# Patient Record
Sex: Male | Born: 1935 | Race: White | Hispanic: No | Marital: Married | Smoking: Former smoker
Health system: Southern US, Community
[De-identification: ages and names within clinical notes are randomized; demographics above are authoritative.]

## PROBLEM LIST (undated history)

## (undated) DIAGNOSIS — I10 Essential (primary) hypertension: Secondary | ICD-10-CM

## (undated) DIAGNOSIS — I4891 Unspecified atrial fibrillation: Secondary | ICD-10-CM

## (undated) HISTORY — PX: NO PAST SURGERIES: SHX2092

---

## 2014-03-08 ENCOUNTER — Ambulatory Visit: Payer: Self-pay | Admitting: Physician Assistant

## 2017-10-11 ENCOUNTER — Ambulatory Visit (INDEPENDENT_AMBULATORY_CARE_PROVIDER_SITE_OTHER): Payer: Medicare HMO

## 2017-10-11 ENCOUNTER — Ambulatory Visit
Admission: EM | Admit: 2017-10-11 | Discharge: 2017-10-11 | Disposition: A | Discharge status: Home / Self Care | Payer: Medicare HMO | Attending: Family Medicine | Admitting: Family Medicine

## 2017-10-11 ENCOUNTER — Encounter: Payer: Self-pay | Admitting: Emergency Medicine

## 2017-10-11 DIAGNOSIS — R6889 Other general symptoms and signs: Secondary | ICD-10-CM | POA: Diagnosis not present

## 2017-10-11 HISTORY — DX: Essential (primary) hypertension: I10

## 2017-10-11 HISTORY — DX: Unspecified atrial fibrillation: I48.91

## 2017-10-11 MED ORDER — CETIRIZINE HCL 5 MG PO TABS: 5.0000 mg | ORAL_TABLET | Freq: Every day | ORAL | 0 refills | Status: DC

## 2017-10-11 MED ORDER — FLUTICASONE PROPIONATE 50 MCG/ACT NA SUSP: 2.0000 | Freq: Every day | NASAL | 0 refills | Status: DC

## 2017-10-11 NOTE — ED Triage Notes (Signed)
Patient c/o cough, chest congestion and nasal congestion for 3 weeks.  Patient denies fevers.

## 2017-10-11 NOTE — Discharge Instructions (Signed)
Medications as prescribed. ° °Take care ° °Dr. Kingson Lohmeyer  °

## 2017-10-11 NOTE — ED Provider Notes (Signed)
[facility]    CSN: [id number] Arrival date & time: 10/11/17  1016  History   Chief Complaint Chief Complaint  Patient presents with  . Cough  . Nasal Congestion   HPI   82 year old male with age or fibrillation and hyperlipidemia presents with a 3-4 week history of congestion.  Patient states that he feels like there is phlegm stuck in his throat. He is also having to blow his nose frequently in the morning. This occurs predominantly in the morning. Has been worsening. Some associated cough. No shortness of breath. No PND or orthopnea. No known exacerbating or relieving factors. He denies a prior history of allergies. He reports that he gets his phlegm up its clear to sometimes yellow. He has no fever. No shortness of breath. He is able to exercise without difficulty. No reports of chest pain. No other associated symptoms. No other complaints or concerns at this time.  Past Medical History:  Diagnosis Date  . Atrial fibrillation (HCC)   . Hyperlipidemia   ED BPH Hx of Depression  Surgical Hx - Skin biopsy.  Home Medications    Prior to Admission medications   Medication Sig Start Date End Date Taking? Authorizing Provider  lovastatin (MEVACOR) 40 MG tablet Take 40 mg by mouth at bedtime.   Yes [provider]  metoprolol succinate (TOPROL-XL) 25 MG 24 hr tablet Take 25 mg by mouth daily.   Yes [provider]  warfarin (COUMADIN) 6 MG tablet Take 6 mg by mouth daily.   Yes [provider]  cetirizine (ZYRTEC) 5 MG tablet Take 1 tablet (5 mg total) by mouth daily. 10/11/17   Tommie Samsook, Bryanna Yim G, DO  fluticasone (FLONASE) 50 MCG/ACT nasal spray Place 2 sprays into both nostrils daily. 10/11/17   Tommie Samsook, Tuleen Mandelbaum G, DO   Family History Coronary Artery Disease (Blocked arteries around heart) Father    Coronary Artery Disease (Blocked arteries around heart) Mother     Social History Social History  Substance Use Topics  . Smoking status: Former  Games developermoker  . Smokeless tobacco: Never Used  . Alcohol use Yes   Allergies   Patient has no known allergies.  Review of Systems Review of Systems  Constitutional: Negative for fever.  HENT: Positive for congestion.   Respiratory: Positive for cough. Negative for shortness of breath.   Cardiovascular: Negative for chest pain and leg swelling.  All other systems reviewed and are negative.  Physical Exam Triage Vital Signs ED Triage Vitals  Enc Vitals Group     BP 10/11/17 1030 (!) 148/79     Pulse Rate 10/11/17 1030 91     Resp 10/11/17 1030 16     Temp 10/11/17 1030 97.8 F (36.6 C)     Temp Source 10/11/17 1030 Oral     SpO2 10/11/17 1030 99 %     Weight 10/11/17 1026 192 lb 7.4 oz (87.3 kg)     Height 10/11/17 1026 5\' 8"  (1.727 m)     Head Circumference --      Peak Flow --      Pain Score 10/11/17 1027 0     Pain Loc --      Pain Edu? --      Excl. in GC? --    Updated Vital Signs BP (!) 148/79 (BP Location: Left Arm)   Pulse 91   Temp 97.8 F (36.6 C) (Oral)   Resp 16   Ht 5\' 8"  (1.727 m)   Wt 192  lb 7.4 oz (87.3 kg)   SpO2 99%   BMI 29.26 kg/m   Physical Exam  Constitutional: He is oriented to person, place, and time. He appears well-developed. No distress.  HENT:  Head: Normocephalic and atraumatic.  Mouth/Throat: Oropharynx is clear and moist.  Normal TM's bilaterally.  Eyes: Conjunctivae are normal. No scleral icterus.  Neck: Neck supple.  Cardiovascular:  No murmur heard. Irregularly, irregular. No LE edema.  Pulmonary/Chest: Effort normal. No respiratory distress. He has no wheezes. He has no rales.  Abdominal: Soft. He exhibits no distension. There is no tenderness.  Lymphadenopathy:    He has no cervical adenopathy.  Neurological: He is alert and oriented to person, place, and time.  Skin: Skin is warm. No rash noted.  Psychiatric: He has a normal mood and affect.  Vitals reviewed.  UC Treatments / Results  Labs (all labs ordered are  listed, but only abnormal results are displayed) Labs Reviewed - No data to display  EKG  EKG Interpretation None       Radiology Dg Chest 2 View  Result Date: 10/11/2017 CLINICAL DATA:  Productive cough with congestion for 3 or 4 weeks. EXAM: CHEST  2 VIEW COMPARISON:  None. FINDINGS: Healed right rib fractures are identified. No pneumothorax. No nodules, masses, or focal infiltrates. The cardiomediastinal silhouette is unremarkable. Degenerative changes seen in the thoracic spine. IMPRESSION: No cause for the patient's cough or congestion identified. Electronically Signed   By: Gerome Sam III M.D   On: 10/11/2017 11:23    Procedures Procedures (including critical care time)  Medications Ordered in UC Medications - No data to display   Initial Impression / Assessment and Plan / UC Course  I have reviewed the triage vital signs and the nursing notes.  Pertinent labs & imaging results that were available during my care of the patient were reviewed by me and considered in my medical decision making (see chart for details).    82 year old male presents with congestion (primary of the throat). Some cough. Lungs clear. Xray negative. No evidence of CHF. Treating with Zyrtec, Flonase.  Final Clinical Impressions(s) / UC Diagnoses   Final diagnoses:  Congestion of throat   New Prescriptions New Prescriptions   CETIRIZINE (ZYRTEC) 5 MG TABLET    Take 1 tablet (5 mg total) by mouth daily.   FLUTICASONE (FLONASE) 50 MCG/ACT NASAL SPRAY    Place 2 sprays into both nostrils daily.   Controlled Substance Prescriptions [place] Controlled Substance Registry consulted? Not Applicable   Tommie Sams, DO 10/11/17 1138

## 2017-11-06 ENCOUNTER — Other Ambulatory Visit: Payer: Self-pay | Admitting: Family Medicine

## 2019-07-07 ENCOUNTER — Other Ambulatory Visit: Payer: Self-pay | Admitting: Family Medicine

## 2019-08-03 ENCOUNTER — Ambulatory Visit (INDEPENDENT_AMBULATORY_CARE_PROVIDER_SITE_OTHER): Payer: Medicare HMO

## 2019-08-03 ENCOUNTER — Other Ambulatory Visit: Payer: Self-pay

## 2019-08-03 ENCOUNTER — Ambulatory Visit
Admission: EM | Admit: 2019-08-03 | Discharge: 2019-08-03 | Disposition: A | Discharge status: Home / Self Care | Payer: Medicare HMO | Attending: Family Medicine | Admitting: Family Medicine

## 2019-08-03 DIAGNOSIS — M25522 Pain in left elbow: Secondary | ICD-10-CM | POA: Diagnosis not present

## 2019-08-03 DIAGNOSIS — M7022 Olecranon bursitis, left elbow: Secondary | ICD-10-CM | POA: Diagnosis not present

## 2019-08-03 MED ORDER — PREDNISONE 20 MG PO TABS: 40.0000 mg | ORAL_TABLET | Freq: Every day | ORAL | 0 refills | Status: DC

## 2019-08-03 NOTE — Discharge Instructions (Addendum)
Take medication as prescribed. Rest. Avoid leaning on area frequently. Ice. Stretching. Monitor.   Follow up with your primary care physician or the above in one week as needed.   Return to Urgent care for new or worsening concerns.

## 2019-08-03 NOTE — ED Provider Notes (Signed)
[facility] ____________________________________________  Time seen: Approximately 10:03 AM  I have reviewed the triage vital signs and the nursing notes.   HISTORY  Chief Complaint Elbow Pain  HPI Jacob Moss is a 84 y.o. male presenting for evaluation of left elbow swelling.  Patient reports he has had some mild tenderness to the elbow, but not much.  States he did not notice the swelling but rather his granddaughter noticed the swelling on Saturday.  Denies any fall, direct injury or direct trauma.  Does report he leans on his elbows frequently.  Denies any redness or break in skin.  Continues with full range of motion.  Denies paresthesias.  Denies decreased range of motion.  Denies fevers.  No recent cough, sickness, chest pain, shortness of breath or other complaints.  Denies history of similar.  Right-hand-dominant.  Reports otherwise doing well.    Past Medical History:  Diagnosis Date  . Atrial fibrillation (HCC)   . Hypertension     There are no active problems to display for this patient.   Past Surgical History:  Procedure Laterality Date  . NO PAST SURGERIES       No current facility-administered medications for this encounter.   Current Outpatient Medications:  .  cetirizine (ZYRTEC) 5 MG tablet, Take 1 tablet (5 mg total) by mouth daily., Disp: 30 tablet, Rfl: 0 .  ELIQUIS 5 MG TABS tablet, , Disp: , Rfl:  .  fluticasone (FLONASE) 50 MCG/ACT nasal spray, Place 2 sprays into both nostrils daily., Disp: 16 g, Rfl: 0 .  lovastatin (MEVACOR) 40 MG tablet, Take 40 mg by mouth at bedtime., Disp: , Rfl:  .  metoprolol succinate (TOPROL-XL) 25 MG 24 hr tablet, Take 25 mg by mouth daily., Disp: , Rfl:  .  predniSONE (DELTASONE) 20 MG tablet, Take 2 tablets (40 mg total) by mouth daily., Disp: 10 tablet, Rfl: 0  Allergies Patient has no known allergies.  History reviewed. No pertinent family history.  Social History Social History   Tobacco Use   . Smoking status: Former Games developermoker  . Smokeless tobacco: Never Used  Substance Use Topics  . Alcohol use: Yes    Comment: beer daily  . Drug use: No    Review of Systems Constitutional: No fever Cardiovascular: Denies chest pain. Respiratory: Denies shortness of breath. Gastrointestinal: No abdominal pain.   Genitourinary: Negative for dysuria. Musculoskeletal: Positive left elbow pain. Skin: Negative for rash.   ____________________________________________   PHYSICAL EXAM:  VITAL SIGNS: ED Triage Vitals  Enc Vitals Group     BP 08/03/19 0944 (!) 153/99     Pulse Rate 08/03/19 0944 76     Resp 08/03/19 0944 15     Temp 08/03/19 0944 97.8 F (36.6 C)     Temp Source 08/03/19 0944 Oral     SpO2 08/03/19 0944 100 %     Weight 08/03/19 0942 190 lb (86.2 kg)     Height 08/03/19 0942 5\' 6"  (1.676 m)     Head Circumference --      Peak Flow --      Pain Score 08/03/19 0942 2     Pain Loc --      Pain Edu? --      Excl. in GC? --     Constitutional: Alert and oriented. Well appearing and in no acute distress. Eyes: Conjunctivae are normal.  ENT      Head: Normocephalic and atraumatic. Cardiovascular: Normal rate, regular rhythm. Grossly normal heart  sounds.  Good peripheral circulation. Respiratory: Normal respiratory effort without tachypnea nor retractions. Breath sounds are clear and equal bilaterally. No wheezes, rales, rhonchi. Musculoskeletal: Steady gait.  Bilateral distal radial pulses equal nasal palpated.  Bilateral hand grip strong and equal. Except: Left elbow swelling noted to left olecranon posteriorly, minimal posterior tenderness, able to fully extend as well to fully flex elbow without pain complaints, skin intact, no erythema, left upper extremity otherwise nontender. Neurologic:  Normal speech and language.  Skin:  Skin is warm, dry and intact. No rash noted. Psychiatric: Mood and affect are normal. Speech and behavior are normal. Patient exhibits  appropriate insight and judgment   ___________________________________________   LABS (all labs ordered are listed, but only abnormal results are displayed)  Labs Reviewed - No data to display ____________________________________________  RADIOLOGY  Dg Elbow Complete Left  Result Date: 08/03/2019 CLINICAL DATA:  Elbow pain and swelling. Possible bursitis. EXAM: LEFT ELBOW - COMPLETE 3+ VIEW COMPARISON:  None. FINDINGS: There is moderate to prominent soft tissue swelling posterior to the olecranon. No underlying osseous erosion, fracture, dislocation, soft tissue emphysema, radiopaque foreign body is identified. IMPRESSION: Soft tissue swelling posterior to the olecranon which may reflect bursitis. No evidence of acute osseous abnormality. Electronically Signed   By: Logan Bores M.D.   On: 08/03/2019 10:44   ____________________________________________   PROCEDURES Procedures   INITIAL IMPRESSION / ASSESSMENT AND PLAN / ED COURSE  Pertinent labs & imaging results that were available during my care of the patient were reviewed by me and considered in my medical decision making (see chart for details).  Well-appearing patient.  No acute distress.  Left elbow olecranon bursitis.  Recommend supportive care, avoidance of frequent leaning on elbows, no suspect cause.  No erythema, and no signs of infection at this time.  Will treat with prednisone.  Recommend ice, supportive care.  Monitoring and follow-up.Discussed indication, risks and benefits of medications with patient.  Discussed follow up with Primary care physician or orthopedic as needed. Discussed follow up and return parameters including no resolution or any worsening concerns. Patient verbalized understanding and agreed to plan.   ____________________________________________   FINAL CLINICAL IMPRESSION(S) / ED DIAGNOSES  Final diagnoses:  Olecranon bursitis of left elbow     ED Discharge Orders         Ordered     predniSONE (DELTASONE) 20 MG tablet  Daily     08/03/19 1048           Note: This dictation was prepared with Dragon dictation along with smaller phrase technology. Any transcriptional errors that result from this process are unintentional.         Marylene Land, NP 08/03/19 1115

## 2019-08-03 NOTE — ED Triage Notes (Signed)
Patient has swelling on his left elbow, possible bursitis. Patient states that he noticed this a few days ago but is not very painful.

## 2020-03-22 IMAGING — CR LEFT ELBOW - COMPLETE 3+ VIEW
4 series · 4 of 4 positions shown · non-contrast
Comparison: None.

CLINICAL DATA: Elbow pain and swelling. Possible bursitis.

EXAM:
LEFT ELBOW - COMPLETE 3+ VIEW

[elbow ap]
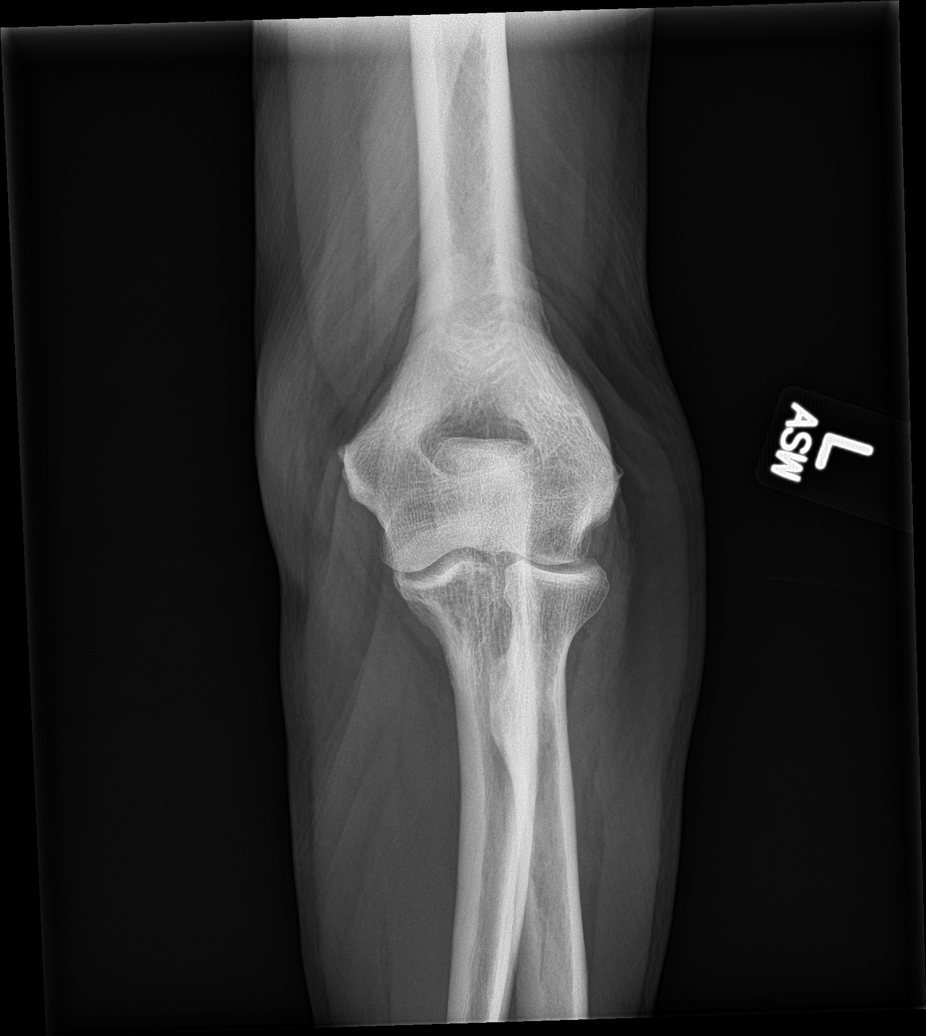

[elbow obl (1 of 2)]
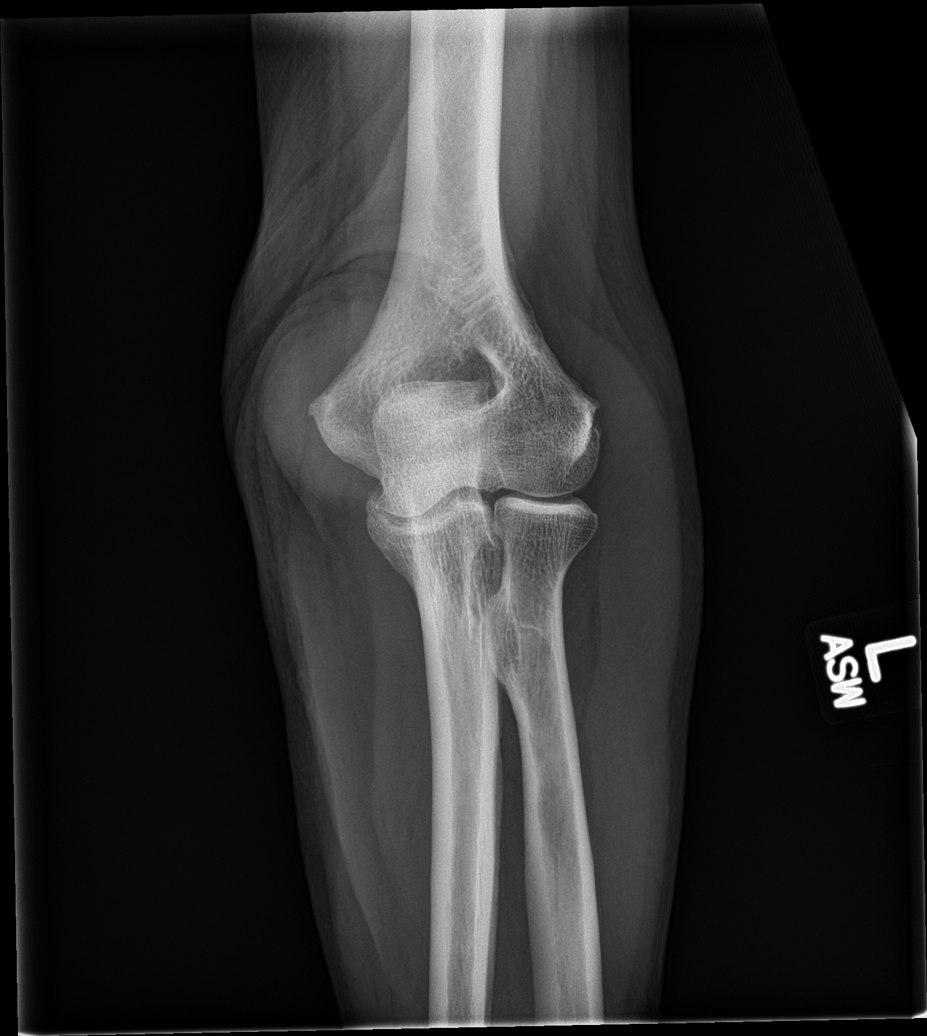

[elbow obl (2 of 2)]
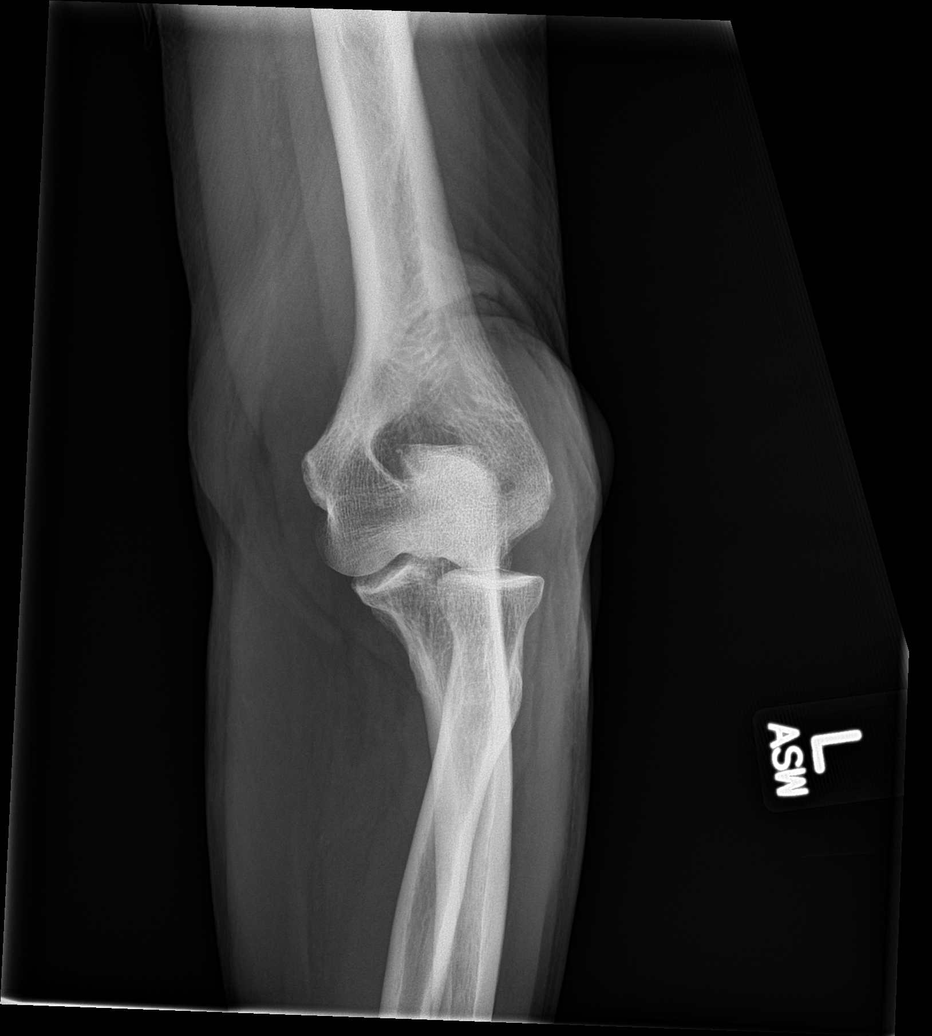

[elbow lat]
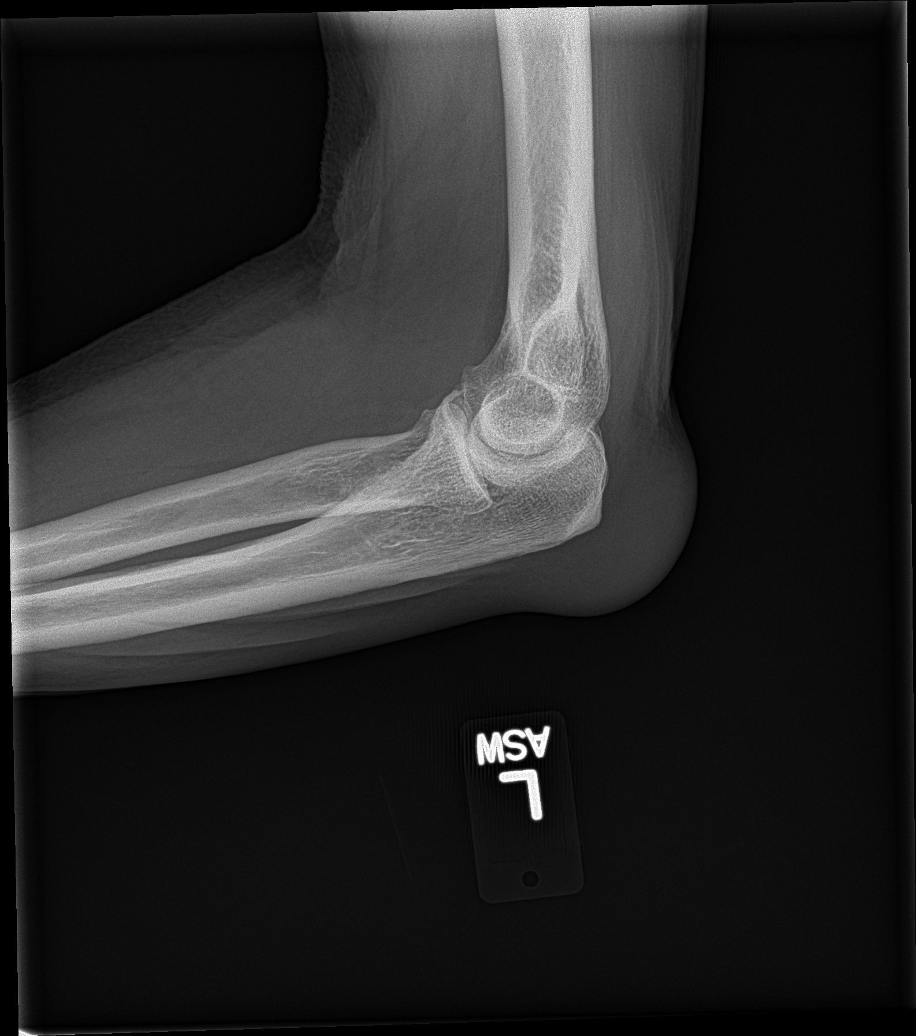

[4 of 4 positions shown; findings below may reference images not displayed]

FINDINGS: There is moderate to prominent soft tissue swelling posterior to the
olecranon. No underlying osseous erosion, fracture, dislocation,
soft tissue emphysema, radiopaque foreign body is identified.
IMPRESSION: Soft tissue swelling posterior to the olecranon which may reflect
bursitis. No evidence of acute osseous abnormality.

## 2022-04-13 ENCOUNTER — Ambulatory Visit
Admission: EM | Admit: 2022-04-13 | Discharge: 2022-04-13 | Disposition: A | Discharge status: Home / Self Care | Payer: Medicare HMO | Attending: Family | Admitting: Family

## 2022-04-13 DIAGNOSIS — L02414 Cutaneous abscess of left upper limb: Secondary | ICD-10-CM | POA: Diagnosis not present

## 2022-04-13 DIAGNOSIS — L03114 Cellulitis of left upper limb: Secondary | ICD-10-CM

## 2022-04-13 MED ORDER — CEPHALEXIN 500 MG PO CAPS: 500.0000 mg | ORAL_CAPSULE | Freq: Three times a day (TID) | ORAL | 0 refills | Status: AC

## 2022-04-13 NOTE — ED Provider Notes (Signed)
?[facility] ? ? ? ?CSN: [id number] ?Arrival date & time: 04/13/22  6720 ? ? ?  ? ?History   ?Chief Complaint ?Chief Complaint  ?Patient presents with  ? Abscess  ? ? ?HPI ?Jacob Moss is a 87 y.o. male.  ? ?87 year old male presents with abscess/swelling and redness on his left upper arm just above his elbow. He has had a small cyst present for many years but became more inflamed about 1 week ago. In the past few days it has become more red, swollen and is draining yellowish discharge. Denies any fever or GI symptoms. Has tried applying hydrogen peroxide to the area with minimal relief. No previous similar infection/abscess. Other chronic health issues include HTN, hyperlipidemia, A fib, and newly diagnosed Parkinson's disease. Currently on Toprol XL, Mevacor, Eliquis, and Carbidopa-Levodopa daily.   ? ?The history is provided by the patient.  ? ?Past Medical History:  ?Diagnosis Date  ? Atrial fibrillation (HCC)   ? Hypertension   ? ? ?There are no problems to display for this patient. ? ? ?Past Surgical History:  ?Procedure Laterality Date  ? NO PAST SURGERIES    ? ? ? ? ? ?Home Medications   ? ?Prior to Admission medications   ?Medication Sig Start Date End Date Taking? Authorizing Provider  ?cephALEXin (KEFLEX) 500 MG capsule Take 1 capsule (500 mg total) by mouth 3 (three) times daily for 10 days. 04/13/22 04/23/22 Yes Odile Veloso, Ali Lowe, NP  ?Everlene Balls 5 MG TABS tablet  07/29/19   [provider]  ?lovastatin (MEVACOR) 40 MG tablet Take 40 mg by mouth at bedtime.    [provider]  ?metoprolol succinate (TOPROL-XL) 25 MG 24 hr tablet Take 25 mg by mouth daily.    [provider]  ?warfarin (COUMADIN) 6 MG tablet Take 6 mg by mouth daily.  08/03/19  [provider]  ? ? ?Family History ?History reviewed. No pertinent family history. ? ?Social History ?Social History  ? ?Tobacco Use  ? Smoking status: Former  ? Smokeless tobacco: Never  ?Vaping Use  ? Vaping Use: Never  used  ?Substance Use Topics  ? Alcohol use: Yes  ?  Comment: beer daily  ? Drug use: No  ? ? ? ?Allergies   ?Patient has no known allergies. ? ? ?Review of Systems ?Review of Systems  ?Constitutional:  Negative for activity change, appetite change, chills, diaphoresis, fatigue and fever.  ?Respiratory:  Negative for chest tightness and shortness of breath.   ?Gastrointestinal:  Negative for nausea and vomiting.  ?Musculoskeletal:  Negative for arthralgias and myalgias.  ?Skin:  Positive for color change and wound.  ?Allergic/Immunologic: Negative for environmental allergies, food allergies and immunocompromised state.  ?Neurological:  Negative for dizziness, seizures, syncope, speech difficulty, numbness and headaches.  ?Hematological:  Negative for adenopathy. Bruises/bleeds easily.  ? ? ?Physical Exam ?Triage Vital Signs ?ED Triage Vitals  ?Enc Vitals Group  ?   BP 04/13/22 0827 (!) 137/98  ?   Pulse Rate 04/13/22 0827 79  ?   Resp 04/13/22 0827 18  ?   Temp 04/13/22 0827 98.1 ?F (36.7 ?C)  ?   Temp Source 04/13/22 0827 Oral  ?   SpO2 04/13/22 0827 98 %  ?   Weight --   ?   Height --   ?   Head Circumference --   ?   Peak Flow --   ?   Pain Score 04/13/22 0828 5  ?  Pain Loc --   ?   Pain Edu? --   ?   Excl. in GC? --   ? ?No data found. ? ?Updated Vital Signs ?BP (!) 137/98 (BP Location: Left Arm)   Pulse 79   Temp 98.1 ?F (36.7 ?C) (Oral)   Resp 18   SpO2 98%  ? ?Visual Acuity ?Right Eye Distance:   ?Left Eye Distance:   ?Bilateral Distance:   ? ?Right Eye Near:   ?Left Eye Near:    ?Bilateral Near:    ? ?Physical Exam ?Vitals and nursing note reviewed.  ?Constitutional:   ?   General: He is awake. He is not in acute distress. ?   Appearance: He is well-developed and well-groomed.  ?   Comments: He is sitting on the exam table in no acute distress.   ?HENT:  ?   Head: Normocephalic and atraumatic.  ?   Right Ear: Hearing normal.  ?   Left Ear: Hearing normal.  ?Eyes:  ?   Extraocular Movements: Extraocular  movements intact.  ?   Conjunctiva/sclera: Conjunctivae normal.  ?Cardiovascular:  ?   Rate and Rhythm: Normal rate.  ?Pulmonary:  ?   Effort: Pulmonary effort is normal.  ?Musculoskeletal:     ?   General: Normal range of motion.  ?   Right upper arm: Normal.  ?   Left upper arm: Swelling and tenderness present.  ?     Arms: ? ?   Cervical back: Normal range of motion.  ?   Comments: 2cm by 1cm raised red abscess present on left upper posterior aspect of his arm, just above his elbow. Yellowish center with slightly oozing yellowish discharge. Warm and tender to palpation. About 3cm area of surrounding erythema. No neuro deficits noted. Good distal pulses.   ?Skin: ?   General: Skin is warm.  ?   Capillary Refill: Capillary refill takes less than 2 seconds.  ?   Findings: Abscess, erythema and wound present. No bruising, ecchymosis or petechiae.  ?Neurological:  ?   General: No focal deficit present.  ?   Mental Status: He is alert and oriented to person, place, and time.  ?   Sensory: Sensation is intact. No sensory deficit.  ?Psychiatric:     ?   Mood and Affect: Mood normal.     ?   Behavior: Behavior normal. Behavior is cooperative.     ?   Thought Content: Thought content normal.     ?   Judgment: Judgment normal.  ? ? ? ?UC Treatments / Results  ?Labs ?(all labs ordered are listed, but only abnormal results are displayed) ?Labs Reviewed - No data to display ? ?EKG ? ? ?Radiology ?No results found. ? ?Procedures ?Procedures (including critical care time) ? ?Medications Ordered in UC ?Medications - No data to display ? ?Initial Impression / Assessment and Plan / UC Course  ?I have reviewed the triage vital signs and the nursing notes. ? ?Pertinent labs & imaging results that were available during my care of the patient were reviewed by me and considered in my medical decision making (see chart for details). ? ?  ? ?Reviewed with patient that he has a skin infection which is slowly spreading. Did not I & D  abscess since it is slowly draining on own. Encouraged patient to apply warm compresses to area 3 to 4 times a day to help with drainage. Start Keflex 500mg  3 times a day for 10 days. May  take Tylenol 500mg  every 6 to 8 hours as needed for pain. Avoid additional hydrogen peroxide use. May clean with soap and water. Follow-up here or with his PCP in 3 to 4 days if not improving.  ? ?Final Clinical Impressions(s) / UC Diagnoses  ? ?Final diagnoses:  ?Abscess of left arm  ?Cellulitis of left upper extremity  ? ? ? ?Discharge Instructions   ? ?  ?Recommend start Keflex (antibiotic) 500mg  3 times a day for 10 days. Apply warm compresses to area 3 to 4 times a day to help with drainage. May take Tylenol 500mg  every 6 to 8 hours as needed for pain. Follow-up here or with your PCP in 3 to 4 days if not improving.  ? ? ? ?ED Prescriptions   ? ? Medication Sig Dispense Auth. Provider  ? cephALEXin (KEFLEX) 500 MG capsule Take 1 capsule (500 mg total) by mouth 3 (three) times daily for 10 days. 30 capsule , NP  ? ?  ? ?PDMP not reviewed this encounter. ?  ? , NP ?04/14/22 [phone] ? ?

## 2022-04-13 NOTE — Discharge Instructions (Addendum)
Recommend start Keflex (antibiotic) 500mg  3 times a day for 10 days. Apply warm compresses to area 3 to 4 times a day to help with drainage. May take Tylenol 500mg  every 6 to 8 hours as needed for pain. Follow-up here or with your PCP in 3 to 4 days if not improving.  ?

## 2022-04-13 NOTE — ED Triage Notes (Signed)
Pt reports abscess in the left arm x 1 week.States is painful to touch and has yellow drainage.  ?

## 2022-09-11 ENCOUNTER — Ambulatory Visit
Admission: EM | Admit: 2022-09-11 | Discharge: 2022-09-11 | Disposition: A | Discharge status: Home / Self Care | Payer: Medicare HMO

## 2022-09-11 ENCOUNTER — Ambulatory Visit (INDEPENDENT_AMBULATORY_CARE_PROVIDER_SITE_OTHER): Payer: Medicare HMO

## 2022-09-11 DIAGNOSIS — R059 Cough, unspecified: Secondary | ICD-10-CM

## 2022-09-11 DIAGNOSIS — R0989 Other specified symptoms and signs involving the circulatory and respiratory systems: Secondary | ICD-10-CM

## 2022-09-11 MED ORDER — OMEPRAZOLE 20 MG PO CPDR: 20.0000 mg | DELAYED_RELEASE_CAPSULE | Freq: Every day | ORAL | 0 refills | Status: AC

## 2022-09-11 MED ORDER — IPRATROPIUM BROMIDE 0.06 % NA SOLN: 2.0000 | Freq: Four times a day (QID) | NASAL | 12 refills | Status: AC

## 2022-09-11 NOTE — Discharge Instructions (Signed)
Your x-rays did not show any signs of lung or throat abnormality.  Your recent visit to ENT showed that your vocal cords were normal in their function and free of swelling.  There was a collection of secretions in your lower throat which could be causing that sensation that she need to clear her throat all the time.  This may be coming from your nose or it may be coming from your stomach.  To help prevent postnasal drip when she does start using the Atrovent nasal spray, 2 squirts up each nostril every 6 hours, to decrease mucus production and postnasal drip.  I also want you to start taking omeprazole 20 mg each night to help cut down acid production in your stomach which would decrease gastric secretions which may trial up your esophagus and cause irritation of your throat.  I want you to follow-up with your primary care provider for any continued symptoms.

## 2022-09-11 NOTE — ED Provider Notes (Signed)
[facility]    CSN: [id number] Arrival date & time: 09/11/22  1125      History   Chief Complaint Chief Complaint  Patient presents with   Cough   Congestion    HPI Jacob Moss is a 87 y.o. male.   HPI  87 year old male here for evaluation of respiratory complaint.  Patient reports that he has had a cough and congestion for last 2 to 3 months.  He states that he feels like he always has to clear his throat.  His sputum production is clear.  Past Medical History:  Diagnosis Date   Atrial fibrillation (HCC)    Hypertension     There are no problems to display for this patient.   Past Surgical History:  Procedure Laterality Date   NO PAST SURGERIES         Home Medications    Prior to Admission medications   Medication Sig Start Date End Date Taking? Authorizing Provider  buPROPion (WELLBUTRIN XL) 150 MG 24 hr tablet Take 1 tablet by mouth every morning. 08/07/22  Yes [provider]  carbidopa-levodopa (SINEMET IR) 25-100 MG tablet Take by mouth. 03/20/22 03/20/23 Yes [provider]  ELIQUIS 5 MG TABS tablet  07/29/19  Yes [provider]  finasteride (PROSCAR) 5 MG tablet Take 1 tablet by mouth daily. 08/07/22 08/07/23 Yes [provider]  ipratropium (ATROVENT) 0.06 % nasal spray Place 2 sprays into both nostrils 4 (four) times daily. 09/11/22  Yes Becky Augusta, NP  ketoconazole (NIZORAL) 2 % cream Apply 1 Application topically daily. 08/20/22  Yes [provider]  ketoconazole (NIZORAL) 2 % shampoo Apply topically 2 (two) times a week. 08/20/22  Yes [provider]  lovastatin (MEVACOR) 40 MG tablet Take 40 mg by mouth at bedtime.   Yes [provider]  metoprolol succinate (TOPROL-XL) 25 MG 24 hr tablet Take 25 mg by mouth daily.   Yes [provider]  omeprazole (PRILOSEC) 20 MG capsule Take 1 capsule (20 mg total) by mouth daily. 09/11/22 10/11/22 Yes Becky Augusta, NP  tamsulosin  (FLOMAX) 0.4 MG CAPS capsule Take 1 capsule by mouth daily. 08/07/22 08/07/23 Yes [provider]  triamcinolone ointment (KENALOG) 0.1 % Apply topically. 03/24/14 10/18/22 Yes [provider]  warfarin (COUMADIN) 6 MG tablet Take 6 mg by mouth daily.  08/03/19  [provider]    Family History History reviewed. No pertinent family history.  Social History Social History   Tobacco Use   Smoking status: Former   Smokeless tobacco: Never  Building services engineer Use: Never used  Substance Use Topics   Alcohol use: Yes    Comment: beer daily   Drug use: No     Allergies   Patient has no known allergies.   Review of Systems Review of Systems  Constitutional:  Negative for fever.  HENT:  Negative for congestion, rhinorrhea and sore throat.   Respiratory:  Positive for cough. Negative for shortness of breath and wheezing.   Cardiovascular:  Negative for chest pain and leg swelling.     Physical Exam Triage Vital Signs ED Triage Vitals  Enc Vitals Group     BP 09/11/22 1157 (!) 111/59     Pulse Rate 09/11/22 1157 76     Resp 09/11/22 1157 16     Temp 09/11/22 1157 98.2 F (36.8 C)     Temp Source 09/11/22 1157 Oral     SpO2 09/11/22 1157 100 %  Weight 09/11/22 1154 178 lb (80.7 kg)     Height 09/11/22 1154 5\' 6"  (1.676 m)     Head Circumference --      Peak Flow --      Pain Score 09/11/22 1153 0     Pain Loc --      Pain Edu? --      Excl. in GC? --    No data found.  Updated Vital Signs BP (!) 111/59 (BP Location: Left Arm)   Pulse 76   Temp 98.2 F (36.8 C) (Oral)   Resp 16   Ht 5\' 6"  (1.676 m)   Wt 178 lb (80.7 kg)   SpO2 100%   BMI 28.73 kg/m   Visual Acuity Right Eye Distance:   Left Eye Distance:   Bilateral Distance:    Right Eye Near:   Left Eye Near:    Bilateral Near:     Physical Exam Vitals and nursing note reviewed.  Constitutional:      Appearance: Normal appearance. He is not ill-appearing.  HENT:      Head: Normocephalic and atraumatic.     Right Ear: Tympanic membrane, ear canal and external ear normal. There is no impacted cerumen.     Left Ear: Tympanic membrane, ear canal and external ear normal. There is no impacted cerumen.     Nose: Nose normal. No congestion or rhinorrhea.     Mouth/Throat:     Mouth: Mucous membranes are moist.     Pharynx: Oropharynx is clear. No oropharyngeal exudate or posterior oropharyngeal erythema.  Cardiovascular:     Rate and Rhythm: Normal rate. Rhythm irregular.     Pulses: Normal pulses.     Heart sounds: Normal heart sounds. No murmur heard.    No friction rub. No gallop.  Pulmonary:     Effort: Pulmonary effort is normal.     Breath sounds: Normal breath sounds. No stridor. No wheezing, rhonchi or rales.  Musculoskeletal:     Cervical back: Normal range of motion and neck supple.  Lymphadenopathy:     Cervical: No cervical adenopathy.  Skin:    General: Skin is warm and dry.     Capillary Refill: Capillary refill takes less than 2 seconds.     Findings: No erythema or rash.  Neurological:     General: No focal deficit present.     Mental Status: He is alert and oriented to person, place, and time.  Psychiatric:        Mood and Affect: Mood normal.        Behavior: Behavior normal.        Thought Content: Thought content normal.        Judgment: Judgment normal.      UC Treatments / Results  Labs (all labs ordered are listed, but only abnormal results are displayed) Labs Reviewed - No data to display  EKG   Radiology DG Neck Soft Tissue  Result Date: 09/11/2022 CLINICAL DATA:  Cough. EXAM: NECK SOFT TISSUES - 1+ VIEW COMPARISON:  None Available. FINDINGS: There is no evidence of retropharyngeal soft tissue swelling or epiglottic enlargement. The cervical airway is unremarkable and no radio-opaque foreign body identified. IMPRESSION: Negative. Electronically Signed   By: Lupita RaiderJames  Green Jr M.D.   On: 09/11/2022 13:07   DG Chest 2  View  Result Date: 09/11/2022 CLINICAL DATA:  Cough. EXAM: CHEST - 2 VIEW COMPARISON:  October 11, 2017. FINDINGS: The heart size and mediastinal contours are within normal  limits. Both lungs are clear. Old right rib fractures are noted. IMPRESSION: No active cardiopulmonary disease. Electronically Signed   By: Lupita Raider M.D.   On: 09/11/2022 13:06    Procedures Procedures (including critical care time)  Medications Ordered in UC Medications - No data to display  Initial Impression / Assessment and Plan / UC Course  I have reviewed the triage vital signs and the nursing notes.  Pertinent labs & imaging results that were available during my care of the patient were reviewed by me and considered in my medical decision making (see chart for details).   Patient is a pleasant, nontoxic-appearing 87 year old male here for evaluation of 2 to 3 months of continuous cough.  He reports the cough is usually worse first thing in the morning and he describes it as a feeling of needing to clear his throat.  He states that sometimes the coughing spells will last an hour and that is what relieves the sensation.  He does not know what brings it on.  He also reports that sometimes if he puts pressure on the side of his larynx that he has improvement of his symptoms.  He denies runny nose, nasal congestion, shortness of breath, or wheezing.  He also denies any chest pain or swelling in his legs.  He does endorse that he will occasionally have belching that induces a sour taste in his mouth but denies waking up with sour taste or sore throat on a regular basis.  Patient is followed by [facility] and his most recent visit was on 06/04/2022.  The report indicates that he has drainage and dysphagia and feels like a constant buildup of mucus in the back of his throat.  He is not using Flonase, Allegra, or nasal saline use.  He had a barium swallow which showed minimal dysphagia.  He also had his vocal cords analyzed that  time which showed symmetric movement without masses but they did show secretions pooling in the hypopharynx.  The ENT indicates that he had a concern for silent GERD encouraged patient to try some acid control precautions such as elevating the head of his bed.  There is also concern at that time for laryngeal pharyngeal reflux.  Patient does endorse some hoarseness at this time.  Patient is not currently taking any PPIs at this time  Patient's physical exam reveals pearly-gray tympanic membranes bilaterally with normal light reflex and clear external auditory canals.  Nasal mucosa is pink and moist without erythema, edema, or discharge.  Posterior oropharynx is free of erythema, injection, or postnasal drip.  No anterior cervical of adenopathy appreciable exam.  No stridor appreciated when auscultating over the trachea.  Lung sounds are clear to auscultation all fields.  Heart sounds reveal atrial fibrillation with a normal rate.  I suspect that patient is experiencing GERD symptoms and is not currently taking a PPI.  I will obtain a soft tissue neck and chest x-ray to rule out the presence of cardiopulmonary pathology or tracheal laryngeal edema.  If x-rays are negative I will discharge patient home on omeprazole and have him follow-up with his PCP.  Chest x-ray independently reviewed and evaluated by me.  Impression: Lung fields are well aerated.  There is no evidence of infiltrate or effusion.  Cardiomediastinal silhouette appears normal.  Radiology overread is pending. Radiology impression states no active cardiopulmonary disease.  X-ray soft tissue neck independently reviewed and evaluated by me.  Impression: No tracheal narrowing or inflammation of the epiglottis  noted.  Radiology overread is pending. Radiology impression states there is no evidence of retropharyngeal soft tissue swelling or epiglottic enlargement.  The cervical airway is unremarkable and no radiopaque foreign bodies are identified.   Negative exam.  I believe that the patient's symptoms are coming from either silent reflux or from his chronic rhinitis.  He is not currently using any nasal spray or allergy medication.  I will discharge him home and start him on Atrovent nasal spray to help cover potential rhinitis issues and omeprazole to cover for reflux.  I will have the patient follow-up with his PCP.  Final Clinical Impressions(s) / UC Diagnoses   Final diagnoses:  Globus sensation     Discharge Instructions      Your x-rays did not show any signs of lung or throat abnormality.  Your recent visit to ENT showed that your vocal cords were normal in their function and free of swelling.  There was a collection of secretions in your lower throat which could be causing that sensation that she need to clear her throat all the time.  This may be coming from your nose or it may be coming from your stomach.  To help prevent postnasal drip when she does start using the Atrovent nasal spray, 2 squirts up each nostril every 6 hours, to decrease mucus production and postnasal drip.  I also want you to start taking omeprazole 20 mg each night to help cut down acid production in your stomach which would decrease gastric secretions which may trial up your esophagus and cause irritation of your throat.  I want you to follow-up with your primary care provider for any continued symptoms.     ED Prescriptions     Medication Sig Dispense Auth. Provider   ipratropium (ATROVENT) 0.06 % nasal spray Place 2 sprays into both nostrils 4 (four) times daily. 15 mL Margarette Canada, NP   omeprazole (PRILOSEC) 20 MG capsule Take 1 capsule (20 mg total) by mouth daily. 30 capsule Margarette Canada, NP      PDMP not reviewed this encounter.   Margarette Canada, NP 09/11/22 1332

## 2022-09-11 NOTE — ED Triage Notes (Signed)
Pt c/o cough with congestion and phlegm x2-23months.

## 2022-10-05 ENCOUNTER — Ambulatory Visit
Admission: EM | Admit: 2022-10-05 | Discharge: 2022-10-05 | Disposition: A | Discharge status: Home / Self Care | Payer: Medicare HMO | Attending: Physician Assistant | Admitting: Physician Assistant

## 2022-10-05 DIAGNOSIS — S61412A Laceration without foreign body of left hand, initial encounter: Secondary | ICD-10-CM

## 2022-10-05 MED ORDER — AMOXICILLIN-POT CLAVULANATE 875-125 MG PO TABS: 1.0000 | ORAL_TABLET | Freq: Two times a day (BID) | ORAL | 0 refills | Status: AC

## 2022-10-05 NOTE — ED Provider Notes (Signed)
[facility]    CSN: [id number] Arrival date & time: 10/05/22  0803      History   Chief Complaint Chief Complaint  Patient presents with   Hand Injury    HPI Jacob Moss is a 87 y.o. male presenting for 1 week history of open wound/laceration of the left hand.  Patient says 6 days ago he fell accidentally and scraped his hand on a brick wall.  He says he only has pain where the laceration is.  He has been cleaning it with hydrogen peroxide and iodine every day and leaving it open to air.  He says it looks like it has healed somewhat but is still open.  He denies any bleeding, pustular drainage, swelling or erythema.  He has not had any fevers.  He denies any numbness or weakness of the hand.  He does have a history of Parkinson's, hypertension and atrial fibrillation.  He is on Eliquis for anticoagulation.  No other complaints.  No other injuries.  HPI  Past Medical History:  Diagnosis Date   Atrial fibrillation ([place])    Hypertension     There are no problems to display for this patient.   Past Surgical History:  Procedure Laterality Date   NO PAST SURGERIES         Home Medications    Prior to Admission medications   Medication Sig Start Date End Date Taking? Authorizing Provider  amoxicillin-clavulanate (AUGMENTIN) 875-125 MG tablet Take 1 tablet by mouth every 12 (twelve) hours for 7 days. 10/05/22 10/12/22 Yes Danton Clap, PA-C  buPROPion (WELLBUTRIN XL) 150 MG 24 hr tablet Take 1 tablet by mouth every morning. 08/07/22  Yes [provider]  carbidopa-levodopa (SINEMET IR) 25-100 MG tablet Take by mouth. 03/20/22 03/20/23 Yes [provider]  ELIQUIS 5 MG TABS tablet  07/29/19  Yes [provider]  finasteride (PROSCAR) 5 MG tablet Take 1 tablet by mouth daily. 08/07/22 08/07/23 Yes [provider]  ipratropium (ATROVENT) 0.06 % nasal spray Place 2 sprays into both nostrils 4 (four) times daily. 09/11/22  Yes Margarette Canada, NP  ketoconazole (NIZORAL) 2 % cream Apply 1 Application topically daily. 08/20/22  Yes [provider]  ketoconazole (NIZORAL) 2 % shampoo Apply topically 2 (two) times a week. 08/20/22  Yes [provider]  lovastatin (MEVACOR) 40 MG tablet Take 40 mg by mouth at bedtime.   Yes [provider]  omeprazole (PRILOSEC) 20 MG capsule Take 1 capsule (20 mg total) by mouth daily. 09/11/22 10/11/22 Yes Margarette Canada, NP  tamsulosin (FLOMAX) 0.4 MG CAPS capsule Take 1 capsule by mouth daily. 08/07/22 08/07/23 Yes [provider]  triamcinolone ointment (KENALOG) 0.1 % Apply topically. 03/24/14 10/18/22 Yes [provider]  metoprolol succinate (TOPROL-XL) 25 MG 24 hr tablet Take 25 mg by mouth daily.    [provider]  warfarin (COUMADIN) 6 MG tablet Take 6 mg by mouth daily.  08/03/19  [provider]    Family History History reviewed. No pertinent family history.  Social History Social History   Tobacco Use   Smoking status: Former   Smokeless tobacco: Never  Scientific laboratory technician Use: Never used  Substance Use Topics   Alcohol use: Yes    Comment: beer daily   Drug use: No     Allergies   Patient has no known allergies.   Review of Systems Review of Systems  Constitutional:  Negative for fever.  Musculoskeletal:  Negative for arthralgias and joint swelling.  Skin:  Positive for wound. Negative for color change.  Neurological:  Negative for weakness and numbness.     Physical Exam Triage Vital Signs ED Triage Vitals  Enc Vitals Group     BP --      Pulse --      Resp --      Temp --      Temp src --      SpO2 --      Weight 10/05/22 0813 178 lb (80.7 kg)     Height 10/05/22 0813 5\' 6"  (1.676 m)     Head Circumference --      Peak Flow --      Pain Score 10/05/22 0812 7     Pain Loc --      Pain Edu? --      Excl. in GC? --    No data found.  Updated Vital Signs BP (!) 152/89 (BP Location: Left  Arm)   Pulse 79   Temp 97.9 F (36.6 C) (Oral)   Resp 18   Ht 5\' 6"  (1.676 m)   Wt 178 lb (80.7 kg)   SpO2 98%   BMI 28.73 kg/m      Physical Exam Vitals and nursing note reviewed.  Constitutional:      General: He is not in acute distress.    Appearance: He is well-developed.  HENT:     Head: Normocephalic and atraumatic.  Eyes:     General: No scleral icterus.    Conjunctiva/sclera: Conjunctivae normal.  Cardiovascular:     Rate and Rhythm: Normal rate.  Pulmonary:     Effort: Pulmonary effort is normal. No respiratory distress.  Musculoskeletal:     Cervical back: Neck supple.  Skin:    General: Skin is warm and dry.     Capillary Refill: Capillary refill takes less than 2 seconds.     Comments: 4 cm laceration of the left palm.  See image below.  It is tender to palpation.  He has somewhat reduced range of motion with making a fist due to pain.  There is no swelling, erythema, drainage and no active bleeding.  Good pulses.  Neurological:     General: No focal deficit present.     Mental Status: He is alert. Mental status is at baseline.     Motor: No weakness.     Gait: Gait normal.  Psychiatric:        Mood and Affect: Mood normal.        Behavior: Behavior normal.       UC Treatments / Results  Labs (all labs ordered are listed, but only abnormal results are displayed) Labs Reviewed - No data to display  EKG   Radiology No results found.  Procedures Procedures (including critical care time)  Medications Ordered in UC Medications - No data to display  Initial Impression / Assessment and Plan / UC Course  I have reviewed the triage vital signs and the nursing notes.  Pertinent labs & imaging results that were available during my care of the patient were reviewed by me and considered in my medical decision making (see chart for details).   87 year old male presents for open wound of the left palm for 1 week.  He has been cleaning it with  hydroperoxide and iodine.  He denies any swelling, redness, drainage, fevers.  I have obtained an image and included in the chart.  He is afebrile  and overall well-appearing.  He has some tenderness to palpation of the 4 cm laceration of the left palm there is no increased swelling, erythema, pustular drainage or bleeding.  Does not appear to be infected at this time.  I thoroughly cleansed the wound with antiseptic wound cleaner and applied gauze strips to wound bed and covered with a nonadherent pad and Coban.  Advised patient that he needs to clean the wound every day with soap and water and applied a bandage the same.  I did advise that he follow-up with wound care but he declines to do that and says that his daughter-in-law can come over and help him change the bandage.  She is a Engineer, civil (consulting).  We discussed that if the wound is not healed in the next week or there are any signs of infection he needs to follow back up here or with PCP.  I did send Augmentin to pharmacy to try to prevent infection.  Advised to follow-up here as needed.   Final Clinical Impressions(s) / UC Diagnoses   Final diagnoses:  Laceration of left hand without foreign body, initial encounter     Discharge Instructions      -It has been too long since you sustained her injury to place any sutures. - I have cleaned the wound with wound cleanser and applied a bandage.  You should clean it every day with soap and water and apply the bandage the same way over the next week.  It will probably take another 7 to 10 days for this to heal. - I sent antibiotics to pharmacy to try to prevent infection for you. - If you notice increased swelling, redness, pain, pustular drainage or have fever you need to return or go to ER for reevaluation.  If it has not closed up in a week you should also return for reevaluation to see if you need wound care since you have declined wound care at this time.     ED Prescriptions     Medication Sig  Dispense Auth. Provider   amoxicillin-clavulanate (AUGMENTIN) 875-125 MG tablet Take 1 tablet by mouth every 12 (twelve) hours for 7 days. 14 tablet Gareth Morgan      PDMP not reviewed this encounter.   Shirlee Latch, PA-C 10/05/22 [phone]

## 2022-10-05 NOTE — Discharge Instructions (Signed)
-  It has been too long since you sustained her injury to place any sutures. - I have cleaned the wound with wound cleanser and applied a bandage.  You should clean it every day with soap and water and apply the bandage the same way over the next week.  It will probably take another 7 to 10 days for this to heal. - I sent antibiotics to pharmacy to try to prevent infection for you. - If you notice increased swelling, redness, pain, pustular drainage or have fever you need to return or go to ER for reevaluation.  If it has not closed up in a week you should also return for reevaluation to see if you need wound care since you have declined wound care at this time.

## 2022-10-05 NOTE — ED Triage Notes (Signed)
Pt is here alone and drove himself.  Pt c/o open wound on left palm after falling down the steps and landing on concrete x1week  Wound is across the palm from the 2nd finger to the 4th.   Pt has been using hydrogen peroxide and iodine to clean the wound.   Pt states that it is currently hurting and has some swelling along his palm and back of the hand.   Pt states that it is painful to bend his hand.  Pt declines dressing the wound and states that bandaids would not stick because of how large it is.   Pt does not know his medication list but states that he is taking eliquis.
